# Patient Record
Sex: Female | Born: 1965 | Race: Black or African American | Hispanic: No | Marital: Married
Health system: Southern US, Community
[De-identification: ages and names within clinical notes are randomized; demographics above are authoritative.]

---

## 2014-01-27 ENCOUNTER — Other Ambulatory Visit: Payer: Self-pay | Admitting: Obstetrics & Gynecology

## 2014-01-27 DIAGNOSIS — N951 Menopausal and female climacteric states: Secondary | ICD-10-CM

## 2014-02-19 ENCOUNTER — Other Ambulatory Visit: Payer: Self-pay

## 2016-12-14 ENCOUNTER — Encounter: Payer: Self-pay | Admitting: Obstetrics & Gynecology

## 2021-10-19 ENCOUNTER — Other Ambulatory Visit: Payer: Self-pay | Admitting: Obstetrics & Gynecology

## 2021-10-19 DIAGNOSIS — Z1231 Encounter for screening mammogram for malignant neoplasm of breast: Secondary | ICD-10-CM

## 2021-10-22 ENCOUNTER — Other Ambulatory Visit: Payer: Self-pay | Admitting: Obstetrics & Gynecology

## 2021-10-22 DIAGNOSIS — C7989 Secondary malignant neoplasm of other specified sites: Secondary | ICD-10-CM

## 2021-11-23 ENCOUNTER — Other Ambulatory Visit: Payer: Self-pay

## 2021-12-20 ENCOUNTER — Other Ambulatory Visit: Payer: Self-pay

## 2022-03-10 ENCOUNTER — Ambulatory Visit: Payer: Self-pay

## 2022-03-10 ENCOUNTER — Other Ambulatory Visit: Payer: Self-pay | Admitting: Obstetrics & Gynecology

## 2022-03-10 ENCOUNTER — Ambulatory Visit
Admission: RE | Admit: 2022-03-10 | Discharge: 2022-03-10 | Disposition: A | Discharge status: Home / Self Care | Payer: Federal, State, Local not specified - PPO | Source: Ambulatory Visit | Attending: Obstetrics & Gynecology | Admitting: Obstetrics & Gynecology

## 2022-03-10 DIAGNOSIS — C7989 Secondary malignant neoplasm of other specified sites: Secondary | ICD-10-CM

## 2022-03-10 DIAGNOSIS — Z1239 Encounter for other screening for malignant neoplasm of breast: Secondary | ICD-10-CM

## 2023-03-09 ENCOUNTER — Other Ambulatory Visit: Payer: Self-pay | Admitting: Obstetrics and Gynecology

## 2023-03-09 DIAGNOSIS — Z1231 Encounter for screening mammogram for malignant neoplasm of breast: Secondary | ICD-10-CM

## 2023-06-18 ENCOUNTER — Ambulatory Visit: Payer: Federal, State, Local not specified - PPO

## 2023-06-25 ENCOUNTER — Ambulatory Visit
Admission: RE | Admit: 2023-06-25 | Discharge: 2023-06-25 | Disposition: A | Discharge status: Home / Self Care | Payer: Federal, State, Local not specified - PPO | Source: Ambulatory Visit | Attending: Obstetrics and Gynecology | Admitting: Obstetrics and Gynecology

## 2023-06-25 DIAGNOSIS — Z1231 Encounter for screening mammogram for malignant neoplasm of breast: Secondary | ICD-10-CM

## 2023-07-23 IMAGING — MG MM DIGITAL SCREENING BILAT W/ TOMO AND CAD
6 of 12 series · 6 of 36 positions shown · non-contrast
Comparison: Previous exam(s).

CLINICAL DATA: Screening.

EXAM:
DIGITAL SCREENING BILATERAL MAMMOGRAM WITH TOMOSYNTHESIS AND CAD
TECHNIQUE: Bilateral screening digital craniocaudal and mediolateral oblique
mammograms were obtained. Bilateral screening digital breast
tomosynthesis was performed. The images were evaluated with
computer-aided detection.

[L MLO synth-2D]
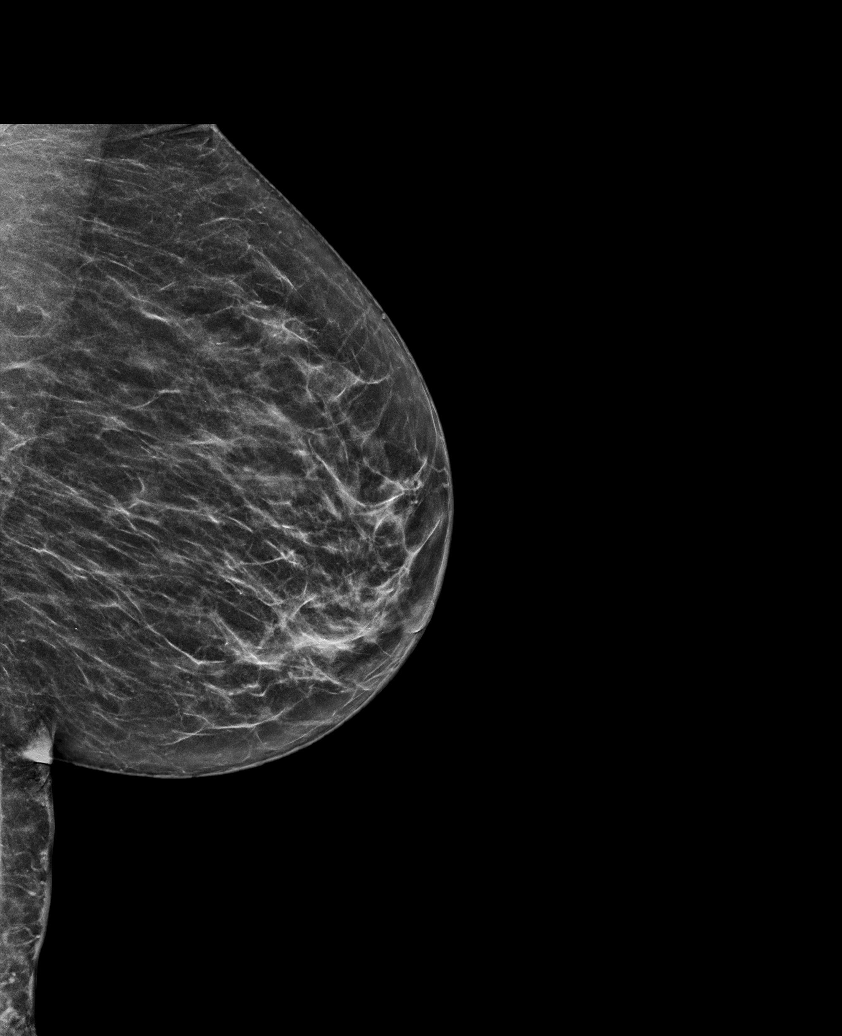

[L CC synth-2D (1 of 2)]
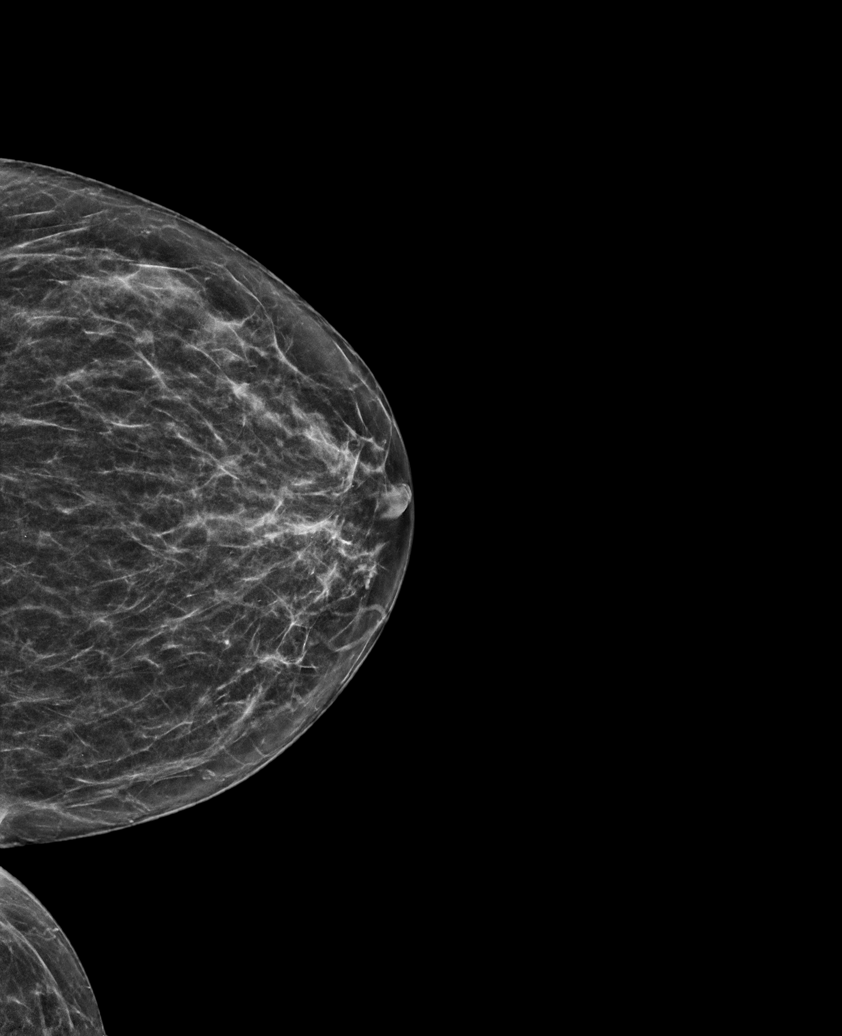

[R CC synth-2D (1 of 2)]
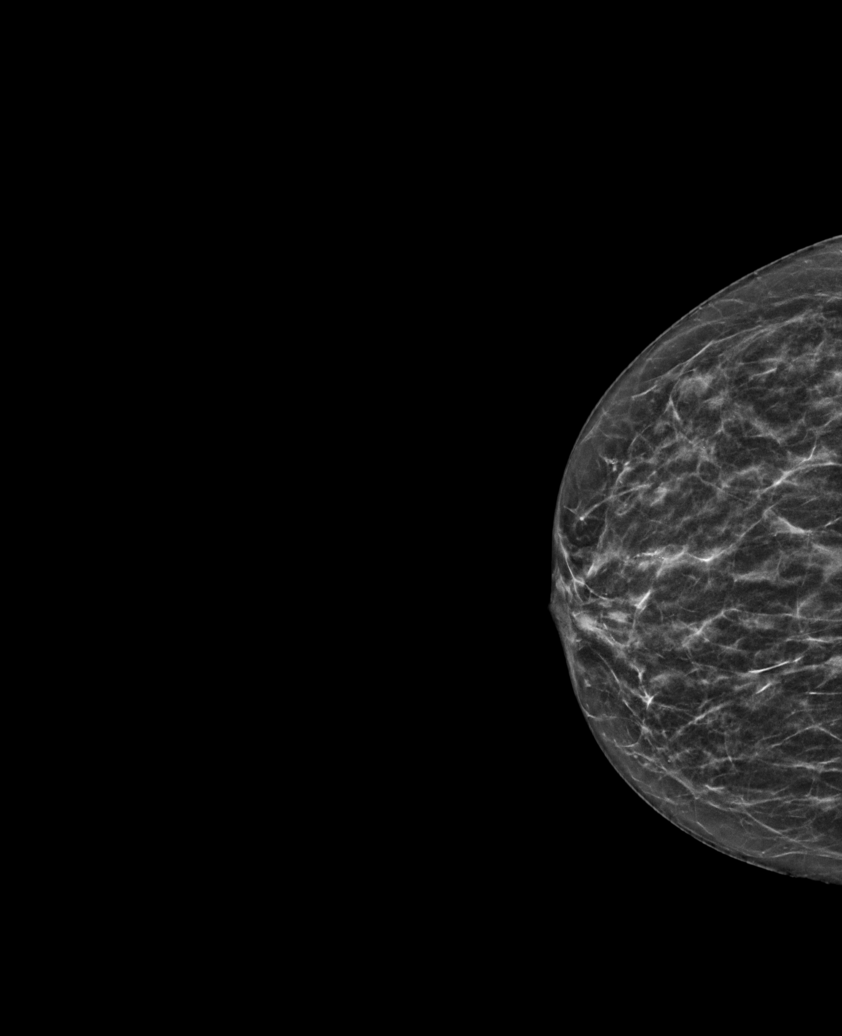

[L CC synth-2D (2 of 2)]
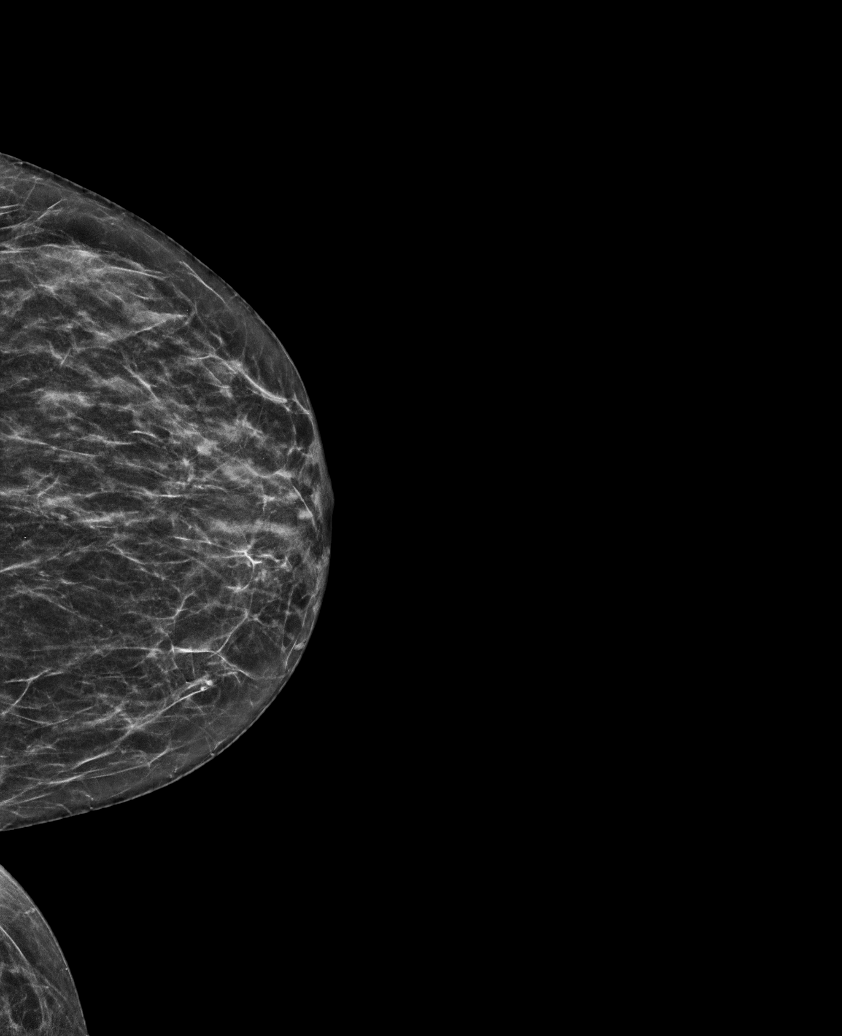

[R CC synth-2D (2 of 2)]
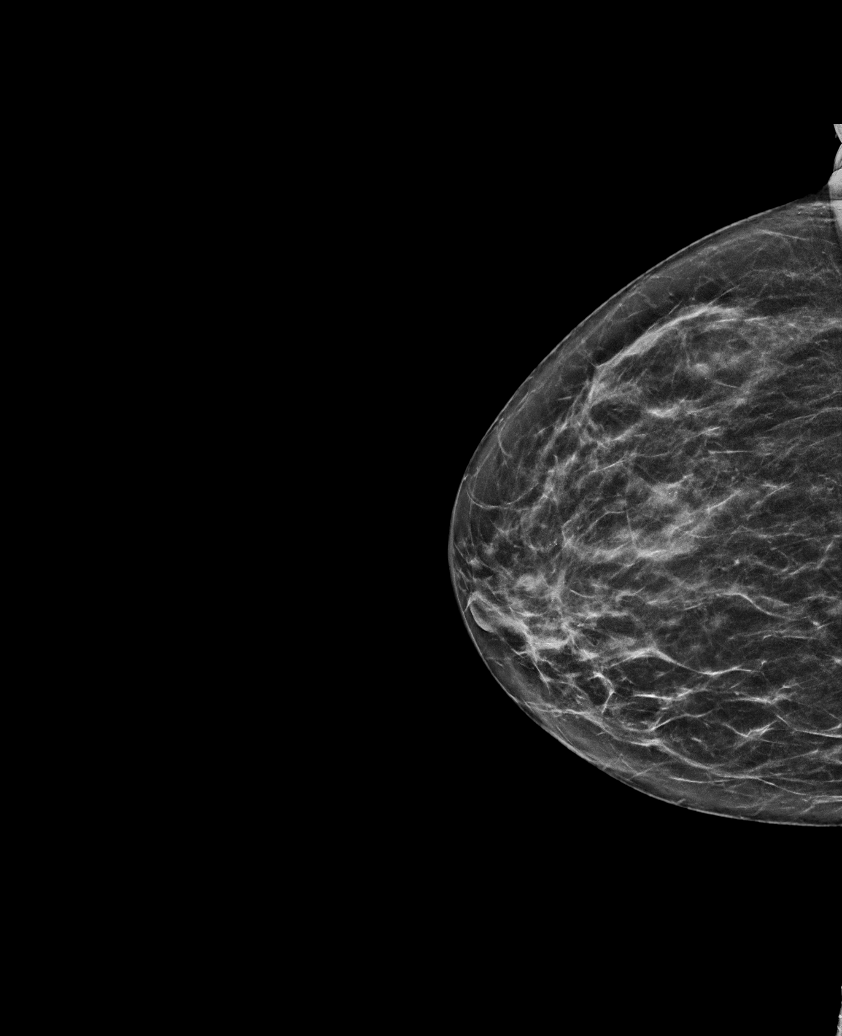

[R MLO synth-2D]
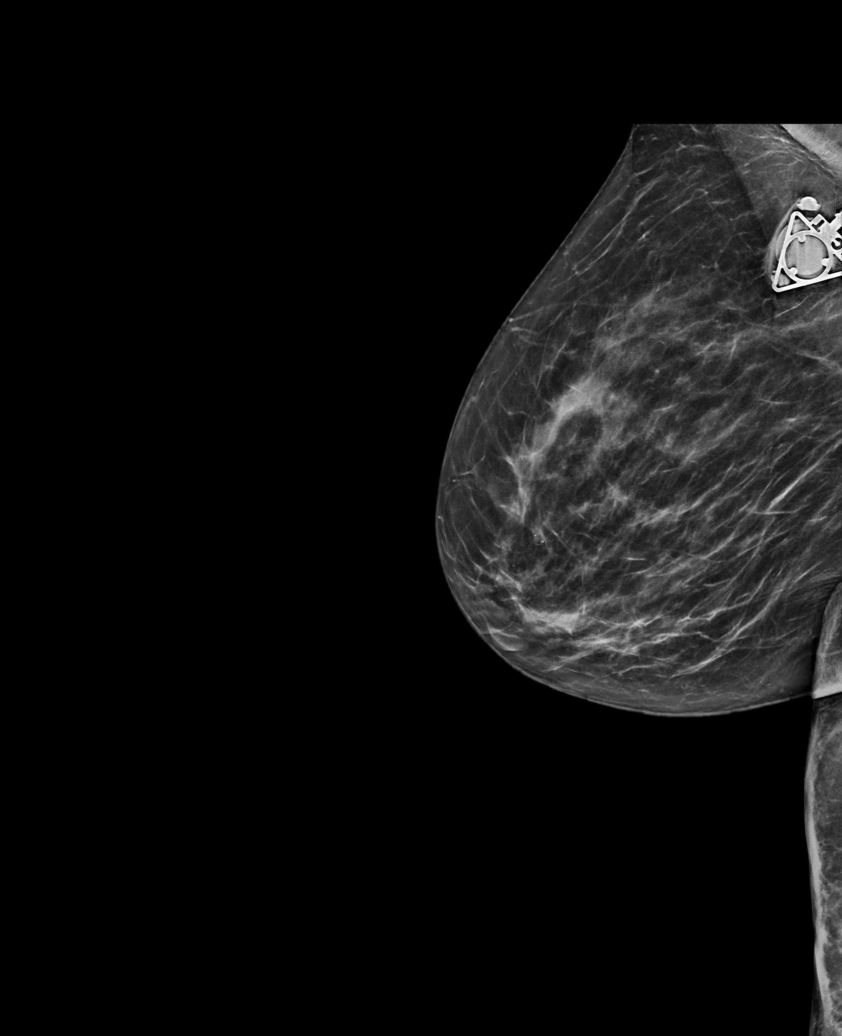

[6 of 36 positions shown; findings below may reference images not displayed]

ACR Breast Density Category b: There are scattered areas of
fibroglandular density.
FINDINGS: There are no findings suspicious for malignancy.
IMPRESSION: No mammographic evidence of malignancy. A result letter of this
screening mammogram will be mailed directly to the patient.

RECOMMENDATION:
Screening mammogram in one year. (Code:51-O-LD2)

BI-RADS CATEGORY  1: Negative.

## 2024-05-26 ENCOUNTER — Other Ambulatory Visit: Payer: Self-pay | Admitting: Internal Medicine

## 2024-05-26 DIAGNOSIS — Z1231 Encounter for screening mammogram for malignant neoplasm of breast: Secondary | ICD-10-CM

## 2024-06-25 ENCOUNTER — Ambulatory Visit

## 2024-06-26 ENCOUNTER — Ambulatory Visit
Admission: RE | Admit: 2024-06-26 | Discharge: 2024-06-26 | Disposition: A | Discharge status: Home / Self Care | Source: Ambulatory Visit | Attending: Internal Medicine | Admitting: Internal Medicine

## 2024-06-26 DIAGNOSIS — Z1231 Encounter for screening mammogram for malignant neoplasm of breast: Secondary | ICD-10-CM
